# Patient Record
Sex: Male | Born: 1985 | Race: White | Hispanic: No | Marital: Married | Smoking: Never smoker
Health system: Southern US, Community
[De-identification: ages and names within clinical notes are randomized; demographics above are authoritative.]

## PROBLEM LIST (undated history)

## (undated) DIAGNOSIS — J45909 Unspecified asthma, uncomplicated: Secondary | ICD-10-CM

---

## 2021-01-28 ENCOUNTER — Other Ambulatory Visit: Payer: Self-pay

## 2021-01-28 ENCOUNTER — Emergency Department (HOSPITAL_BASED_OUTPATIENT_CLINIC_OR_DEPARTMENT_OTHER): Payer: Self-pay

## 2021-01-28 ENCOUNTER — Emergency Department (HOSPITAL_BASED_OUTPATIENT_CLINIC_OR_DEPARTMENT_OTHER)
Admission: EM | Admit: 2021-01-28 | Discharge: 2021-01-28 | Disposition: A | Discharge status: Home / Self Care | Payer: Self-pay | Attending: Emergency Medicine | Admitting: Emergency Medicine

## 2021-01-28 ENCOUNTER — Encounter (HOSPITAL_BASED_OUTPATIENT_CLINIC_OR_DEPARTMENT_OTHER): Payer: Self-pay

## 2021-01-28 DIAGNOSIS — E876 Hypokalemia: Secondary | ICD-10-CM | POA: Insufficient documentation

## 2021-01-28 DIAGNOSIS — J45909 Unspecified asthma, uncomplicated: Secondary | ICD-10-CM | POA: Insufficient documentation

## 2021-01-28 DIAGNOSIS — U071 COVID-19: Secondary | ICD-10-CM | POA: Insufficient documentation

## 2021-01-28 DIAGNOSIS — R112 Nausea with vomiting, unspecified: Secondary | ICD-10-CM

## 2021-01-28 HISTORY — DX: Unspecified asthma, uncomplicated: J45.909

## 2021-01-28 LAB — CBC WITH DIFFERENTIAL/PLATELET
Abs Immature Granulocytes: 0.03 10*3/uL (ref 0.00–0.07)
Basophils Absolute: 0 10*3/uL (ref 0.0–0.1)
Basophils Relative: 0 %
Eosinophils Absolute: 0 10*3/uL (ref 0.0–0.5)
Eosinophils Relative: 0 %
HCT: 40.9 % (ref 39.0–52.0)
Hemoglobin: 14.6 g/dL (ref 13.0–17.0)
Immature Granulocytes: 0 %
Lymphocytes Relative: 6 %
Lymphs Abs: 0.6 10*3/uL — ABNORMAL LOW (ref 0.7–4.0)
MCH: 30 pg (ref 26.0–34.0)
MCHC: 35.7 g/dL (ref 30.0–36.0)
MCV: 84.2 fL (ref 80.0–100.0)
Monocytes Absolute: 1.6 10*3/uL — ABNORMAL HIGH (ref 0.1–1.0)
Monocytes Relative: 16 %
Neutro Abs: 7.8 10*3/uL — ABNORMAL HIGH (ref 1.7–7.7)
Neutrophils Relative %: 78 %
Platelets: 204 10*3/uL (ref 150–400)
RBC: 4.86 MIL/uL (ref 4.22–5.81)
RDW: 13.4 % (ref 11.5–15.5)
WBC: 10.1 10*3/uL (ref 4.0–10.5)
nRBC: 0 % (ref 0.0–0.2)

## 2021-01-28 LAB — COMPREHENSIVE METABOLIC PANEL
ALT: 27 U/L (ref 0–44)
AST: 32 U/L (ref 15–41)
Albumin: 4.4 g/dL (ref 3.5–5.0)
Alkaline Phosphatase: 77 U/L (ref 38–126)
Anion gap: 9 (ref 5–15)
BUN: 15 mg/dL (ref 6–20)
CO2: 23 mmol/L (ref 22–32)
Calcium: 8.7 mg/dL — ABNORMAL LOW (ref 8.9–10.3)
Chloride: 106 mmol/L (ref 98–111)
Creatinine, Ser: 1.04 mg/dL (ref 0.61–1.24)
GFR, Estimated: >60 mL/min (ref >60)
Glucose, Bld: 119 mg/dL — ABNORMAL HIGH (ref 70–99)
Potassium: 3.3 mmol/L — ABNORMAL LOW (ref 3.5–5.1)
Sodium: 138 mmol/L (ref 135–145)
Total Bilirubin: 0.4 mg/dL (ref 0.3–1.2)
Total Protein: 7.6 g/dL (ref 6.5–8.1)

## 2021-01-28 LAB — RESP PANEL BY RT-PCR (FLU A&B, COVID) ARPGX2
Influenza A by PCR: NEGATIVE
Influenza B by PCR: NEGATIVE
SARS Coronavirus 2 by RT PCR: POSITIVE — AB

## 2021-01-28 MED ORDER — ACETAMINOPHEN 500 MG PO TABS
1000.0000 mg | ORAL_TABLET | Freq: Once | ORAL | Status: AC
  Administered 2021-01-28: 1000 mg via ORAL
  Filled 2021-01-28: qty 2

## 2021-01-28 MED ORDER — POTASSIUM CHLORIDE CRYS ER 20 MEQ PO TBCR
40.0000 meq | EXTENDED_RELEASE_TABLET | Freq: Once | ORAL | Status: AC
  Administered 2021-01-28: 40 meq via ORAL
  Filled 2021-01-28: qty 2

## 2021-01-28 MED ORDER — ONDANSETRON HCL 4 MG/2ML IJ SOLN
4.0000 mg | Freq: Once | INTRAMUSCULAR | Status: AC
  Administered 2021-01-28: 4 mg via INTRAVENOUS
  Filled 2021-01-28: qty 2

## 2021-01-28 MED ORDER — ONDANSETRON 4 MG PO TBDP: 4.0000 mg | ORAL_TABLET | Freq: Three times a day (TID) | ORAL | 0 refills | Status: AC | PRN

## 2021-01-28 NOTE — Discharge Instructions (Signed)
Home to quarantine.  You can take Motrin and Tylenol as needed as directed for fever and body aches. Take Zofran as needed as prescribed for vomiting. Return to the ER for severe concerning symptoms otherwise follow-up with your doctor as needed.

## 2021-01-28 NOTE — ED Triage Notes (Signed)
Pt arrives with c/o nausea, vomiting, body aches, and chills starting today.

## 2021-01-28 NOTE — ED Provider Notes (Signed)
[facility] EMERGENCY DEPARTMENT Provider Note   CSN: [id number] Arrival date & time: 01/28/21  2151     History Chief Complaint  Patient presents with   Emesis    Paul Montgomery is a 35 y.o. male.  35 year old male presents with complaint of nausea and vomiting with body aches and chills starting this morning upon waking.  Denies cough, congestion, chest pain, shortness of breath, abdominal pain.  Exposed to his mother who has tested positive for COVID.  Denies changes in bowel or bladder habits.  No other complaints or concerns.  Paul Montgomery was evaluated in Emergency Department on 01/28/2021 for the symptoms described in the history of present illness. He was evaluated in the context of the global COVID-19 pandemic, which necessitated consideration that the patient might be at risk for infection with the SARS-CoV-2 virus that causes COVID-19. Institutional protocols and algorithms that pertain to the evaluation of patients at risk for COVID-19 are in a state of rapid change based on information released by regulatory bodies including the CDC and federal and state organizations. These policies and algorithms were followed during the patient's care in the ED.       Past Medical History:  Diagnosis Date   Asthma     There are no problems to display for this patient.   History reviewed. No pertinent surgical history.     No family history on file.  Social History   Tobacco Use   Smoking status: Never   Smokeless tobacco: Never  Vaping Use   Vaping Use: Never used  Substance Use Topics   Alcohol use: Never   Drug use: Never    Home Medications Prior to Admission medications   Medication Sig Start Date End Date Taking? Authorizing Provider  ondansetron (ZOFRAN ODT) 4 MG disintegrating tablet Take 1 tablet (4 mg total) by mouth every 8 (eight) hours as needed for nausea or vomiting. 01/28/21  Yes Jeannie Fend, PA-C    Allergies    Patient has no known  allergies.  Review of Systems   Review of Systems  Constitutional:  Positive for chills and diaphoresis.  HENT:  Negative for congestion and sore throat.   Respiratory:  Negative for cough and shortness of breath.   Cardiovascular:  Negative for chest pain.  Gastrointestinal:  Positive for nausea and vomiting. Negative for abdominal pain and diarrhea.  Genitourinary:  Negative for difficulty urinating, dysuria and hematuria.  Musculoskeletal:  Positive for arthralgias, back pain and myalgias.  Skin:  Negative for rash and wound.  Allergic/Immunologic: Negative for immunocompromised state.  Neurological:  Negative for weakness.  Hematological:  Negative for adenopathy.  Psychiatric/Behavioral:  Negative for confusion.   All other systems reviewed and are negative.  Physical Exam Updated Vital Signs BP 113/67   Pulse 76   Temp (!) 100.8 F (38.2 C) (Oral)   Resp 20   Ht 5\' 8"  (1.727 m)   Wt 90.7 kg   SpO2 95%   BMI 30.41 kg/m   Physical Exam Vitals and nursing note reviewed.  Constitutional:      General: He is not in acute distress.    Appearance: He is well-developed. He is not diaphoretic.  HENT:     Head: Normocephalic and atraumatic.  Cardiovascular:     Rate and Rhythm: Normal rate and regular rhythm.     Pulses: Normal pulses.     Heart sounds: Normal heart sounds.  Pulmonary:     Effort: Pulmonary effort is normal.  Breath sounds: Normal breath sounds.  Abdominal:     General: There is no distension.     Palpations: Abdomen is soft.  Musculoskeletal:     Cervical back: Neck supple.     Right lower leg: No edema.     Left lower leg: No edema.  Skin:    General: Skin is warm and dry.     Findings: No erythema or rash.  Neurological:     Mental Status: He is alert and oriented to person, place, and time.  Psychiatric:        Behavior: Behavior normal.    ED Results / Procedures / Treatments   Labs (all labs ordered are listed, but only abnormal  results are displayed) Labs Reviewed  RESP PANEL BY RT-PCR (FLU A&B, COVID) ARPGX2 - Abnormal; Notable for the following components:      Result Value   SARS Coronavirus 2 by RT PCR POSITIVE (*)    All other components within normal limits  COMPREHENSIVE METABOLIC PANEL - Abnormal; Notable for the following components:   Potassium 3.3 (*)    Glucose, Bld 119 (*)    Calcium 8.7 (*)    All other components within normal limits  CBC WITH DIFFERENTIAL/PLATELET - Abnormal; Notable for the following components:   Neutro Abs 7.8 (*)    Lymphs Abs 0.6 (*)    Monocytes Absolute 1.6 (*)    All other components within normal limits  URINALYSIS, ROUTINE W REFLEX MICROSCOPIC    EKG None  Radiology DG Chest Port 1 View  Result Date: 01/28/2021 CLINICAL DATA:  Nausea and vomiting and fevers, initial encounter EXAM: PORTABLE CHEST 1 VIEW COMPARISON:  None. FINDINGS: The heart size and mediastinal contours are within normal limits. Both lungs are clear. The visualized skeletal structures are unremarkable. IMPRESSION: No active disease. Electronically Signed   By: Alcide Clever M.D.   On: 01/28/2021 22:29    Procedures Procedures   Medications Ordered in ED Medications  potassium chloride SA (KLOR-CON) CR tablet 40 mEq (has no administration in time range)  ondansetron (ZOFRAN) injection 4 mg (4 mg Intravenous Given 01/28/21 2223)  acetaminophen (TYLENOL) tablet 1,000 mg (1,000 mg Oral Given 01/28/21 2223)    ED Course  I have reviewed the triage vital signs and the nursing notes.  Pertinent labs & imaging results that were available during my care of the patient were reviewed by me and considered in my medical decision making (see chart for details).  Clinical Course as of 01/28/21 2326  [name] Jan 28, 2021  3948 35 year old male with complaint of body aches with chills and sweats and vomiting onset this morning. Patient feels as if he has likely had a fever however has not checked his  temperature.  Exposed to his mother who has tested positive for COVID. Temperature on arrival 100.8.  Vitals otherwise unremarkable including O2 sat 100% on room air. Patient appears to feel unwell, heart and lung exam unremarkable, abdomen soft nontender. CBC with normal white blood cell count.  CMP with mild hypokalemia with potassium of 3.3, given oral potassium.  COVID test pending.  Chest x-ray is unremarkable. Patient was given Zofran for his vomiting as well as Tylenol for his fever. [LM]  2325 Patient is COVID positive, is tolerating POs, will be dc with rx for Zofran, return precautions given.  [LM]    Clinical Course User Index [LM] Eulah Pont Barbaraann Boys   MDM Rules/Calculators/A&P  Final Clinical Impression(s) / ED Diagnoses Final diagnoses:  COVID-19  Non-intractable vomiting with nausea, unspecified vomiting type  Hypokalemia    Rx / DC Orders ED Discharge Orders          Ordered    ondansetron (ZOFRAN ODT) 4 MG disintegrating tablet  Every 8 hours PRN        01/28/21 2324             Jeannie Fend, PA-C 01/28/21 2326    Arby Barrette, MD 02/25/21 1705

## 2021-01-28 NOTE — ED Notes (Signed)
Pt tolerated PO.

## 2022-12-11 IMAGING — DX DG CHEST 1V PORT
1 series · 1 of 1 positions shown · non-contrast
Comparison: None.

CLINICAL DATA: Nausea and vomiting and fevers, initial encounter

EXAM:
PORTABLE CHEST 1 VIEW

[chest ap]
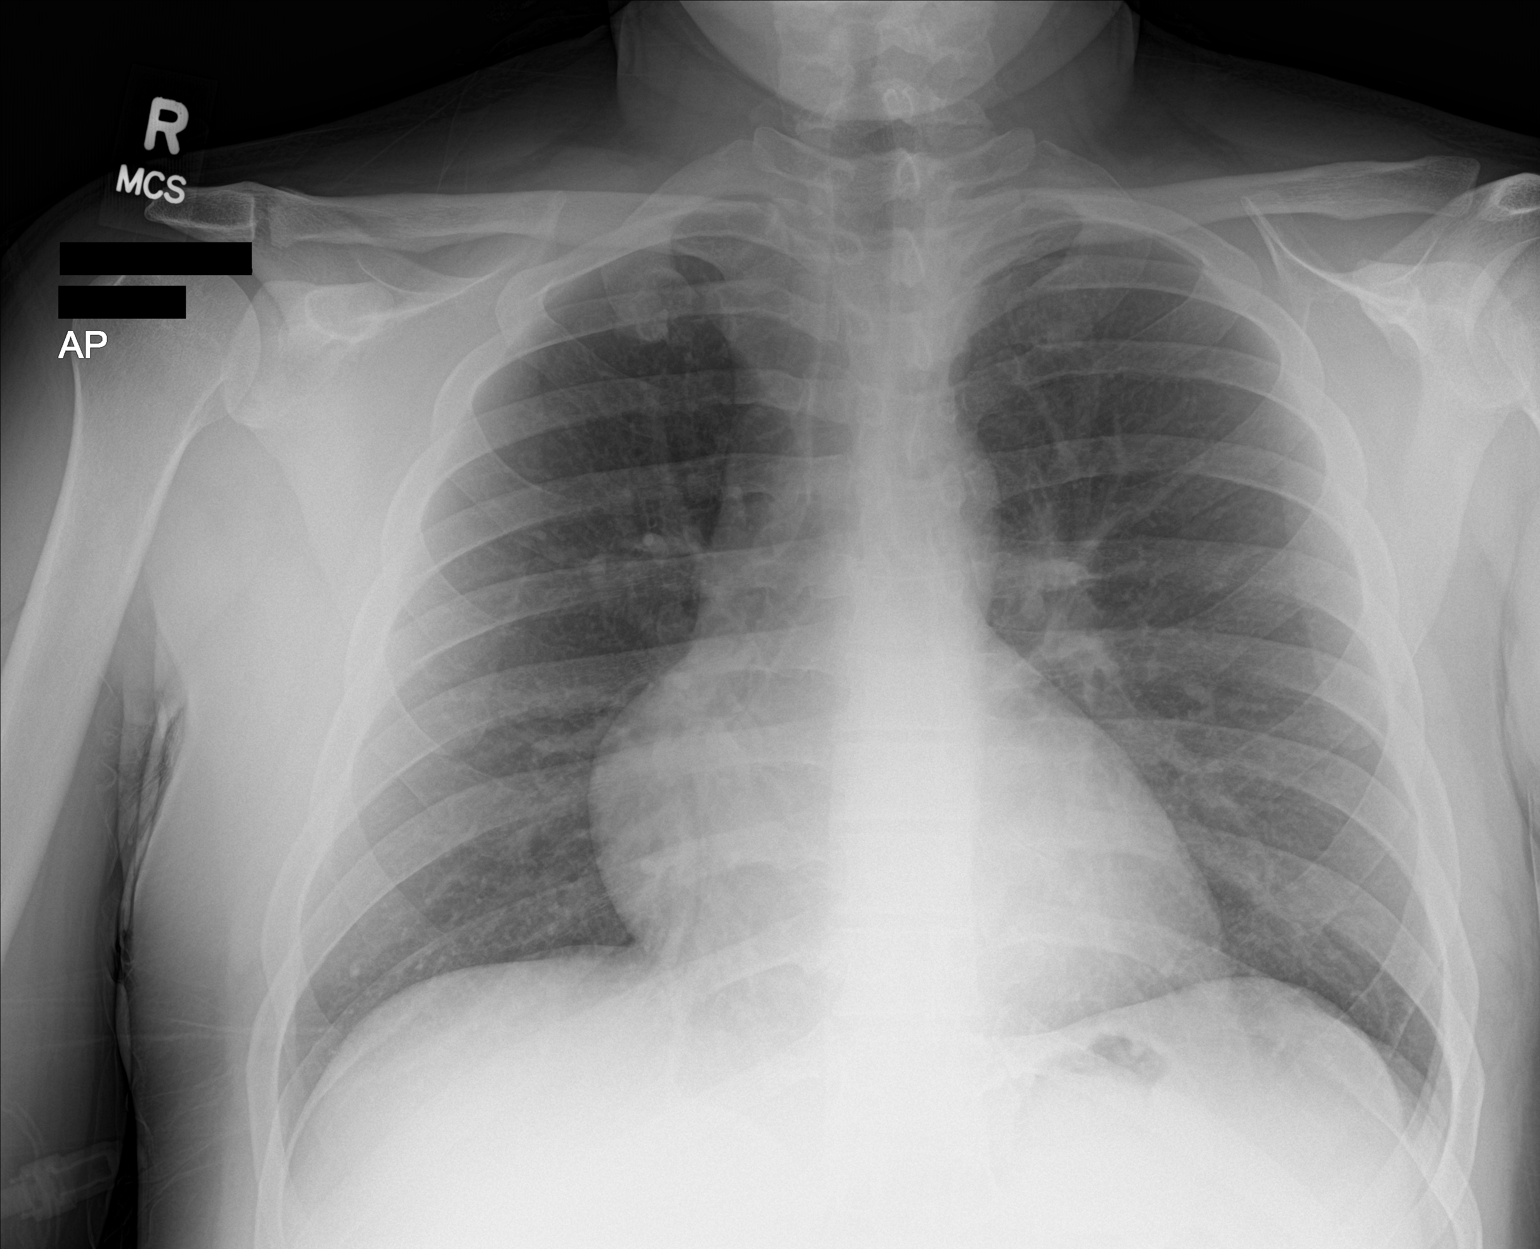

[1 of 1 positions shown; findings below may reference images not displayed]

FINDINGS: The heart size and mediastinal contours are within normal limits.
Both lungs are clear. The visualized skeletal structures are
unremarkable.
IMPRESSION: No active disease.
